# Patient Record
Sex: Female | Born: 1955 | Race: White | Hispanic: No | Marital: Single | Smoking: Never smoker
Health system: Southern US, Community
[De-identification: ages and names within clinical notes are randomized; demographics above are authoritative.]

## PROBLEM LIST (undated history)

## (undated) DIAGNOSIS — M8580 Other specified disorders of bone density and structure, unspecified site: Secondary | ICD-10-CM

## (undated) DIAGNOSIS — D0370 Melanoma in situ of unspecified lower limb, including hip: Secondary | ICD-10-CM

## (undated) HISTORY — DX: Melanoma in situ of unspecified lower limb, including hip: D03.70

---
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
## 2001-08-20 ENCOUNTER — Encounter: Payer: Self-pay | Admitting: Obstetrics and Gynecology

## 2001-08-20 ENCOUNTER — Encounter: Admission: RE | Admit: 2001-08-20 | Discharge: 2001-08-20 | Payer: Self-pay | Admitting: Obstetrics and Gynecology

## 2002-08-21 ENCOUNTER — Encounter: Payer: Self-pay | Admitting: Obstetrics and Gynecology

## 2002-08-21 ENCOUNTER — Encounter: Admission: RE | Admit: 2002-08-21 | Discharge: 2002-08-21 | Payer: Self-pay | Admitting: Obstetrics and Gynecology

## 2003-08-24 ENCOUNTER — Encounter: Admission: RE | Admit: 2003-08-24 | Discharge: 2003-08-24 | Payer: Self-pay | Admitting: Obstetrics and Gynecology

## 2003-08-24 ENCOUNTER — Encounter: Payer: Self-pay | Admitting: Obstetrics and Gynecology

## 2005-09-12 ENCOUNTER — Encounter: Admission: RE | Admit: 2005-09-12 | Discharge: 2005-09-12 | Payer: Self-pay | Admitting: Obstetrics and Gynecology

## 2005-09-15 ENCOUNTER — Encounter: Admission: RE | Admit: 2005-09-15 | Discharge: 2005-09-15 | Payer: Self-pay | Admitting: Obstetrics and Gynecology

## 2006-09-19 ENCOUNTER — Encounter: Admission: RE | Admit: 2006-09-19 | Discharge: 2006-09-19 | Payer: Self-pay | Admitting: Obstetrics and Gynecology

## 2008-06-30 ENCOUNTER — Encounter: Admission: RE | Admit: 2008-06-30 | Discharge: 2008-06-30 | Payer: Self-pay | Admitting: Obstetrics and Gynecology

## 2008-07-08 ENCOUNTER — Encounter: Admission: RE | Admit: 2008-07-08 | Discharge: 2008-07-08 | Payer: Self-pay | Admitting: Obstetrics and Gynecology

## 2009-07-20 ENCOUNTER — Encounter: Admission: RE | Admit: 2009-07-20 | Discharge: 2009-07-20 | Payer: Self-pay | Admitting: Obstetrics and Gynecology

## 2009-12-04 DIAGNOSIS — D0370 Melanoma in situ of unspecified lower limb, including hip: Secondary | ICD-10-CM

## 2009-12-04 HISTORY — PX: OTHER SURGICAL HISTORY: SHX169

## 2009-12-04 HISTORY — DX: Melanoma in situ of unspecified lower limb, including hip: D03.70

## 2010-09-13 ENCOUNTER — Encounter: Admission: RE | Admit: 2010-09-13 | Discharge: 2010-09-13 | Payer: Self-pay | Admitting: Gynecology

## 2010-09-20 ENCOUNTER — Other Ambulatory Visit: Admission: RE | Admit: 2010-09-20 | Discharge: 2010-09-20 | Payer: Self-pay | Admitting: Gynecology

## 2010-09-20 ENCOUNTER — Ambulatory Visit: Payer: Self-pay | Admitting: Gynecology

## 2012-08-09 ENCOUNTER — Other Ambulatory Visit: Payer: Self-pay | Admitting: Gynecology

## 2012-08-09 DIAGNOSIS — Z1231 Encounter for screening mammogram for malignant neoplasm of breast: Secondary | ICD-10-CM

## 2012-08-15 ENCOUNTER — Other Ambulatory Visit (HOSPITAL_COMMUNITY)
Admission: RE | Admit: 2012-08-15 | Discharge: 2012-08-15 | Disposition: A | Discharge status: Home / Self Care | Payer: BC Managed Care – PPO | Source: Ambulatory Visit | Attending: Gynecology | Admitting: Gynecology

## 2012-08-15 ENCOUNTER — Ambulatory Visit (INDEPENDENT_AMBULATORY_CARE_PROVIDER_SITE_OTHER): Payer: BC Managed Care – PPO | Admitting: Gynecology

## 2012-08-15 ENCOUNTER — Encounter: Payer: Self-pay | Admitting: Gynecology

## 2012-08-15 VITALS — BP 94/58 | Ht 65.5 in | Wt 163.0 lb

## 2012-08-15 DIAGNOSIS — Z1151 Encounter for screening for human papillomavirus (HPV): Secondary | ICD-10-CM | POA: Insufficient documentation

## 2012-08-15 DIAGNOSIS — Z01419 Encounter for gynecological examination (general) (routine) without abnormal findings: Secondary | ICD-10-CM | POA: Insufficient documentation

## 2012-08-15 DIAGNOSIS — N926 Irregular menstruation, unspecified: Secondary | ICD-10-CM

## 2012-08-15 NOTE — Patient Instructions (Signed)
Follow up in one year for annual gynecologic exam. Keep menstrual calendar as long as less frequent but "normal" menses then will monitor. If you go more than one year without bleeding and then have a bleed or have a prolonged or atypical period follow up for further evaluation.

## 2012-08-15 NOTE — Progress Notes (Signed)
Destiny Smith 1956/06/20 [id number]        56 y.o.  G0P0 for annual exam.  Several issues noted below.  Past medical history,surgical history, medications, allergies, family history and social history were all reviewed and documented in the EPIC chart. ROS:  Was performed and pertinent positives and negatives are included in the history.  Exam: Destiny Smith assistant Filed Vitals:   08/15/12 1538  BP: 94/58  Height: 5' 5.5" (1.664 m)  Weight: 163 lb (73.936 kg)   General appearance  Normal Skin grossly normal Head/Neck normal with no cervical or supraclavicular adenopathy thyroid normal Lungs  clear Cardiac RR, without RMG Abdominal  soft, nontender, without masses, organomegaly or hernia Breasts  examined lying and sitting without masses, retractions, discharge or axillary adenopathy. Pelvic  Ext/BUS/vagina  normal with atrophic changes  Cervix  normal with atrophic changes. Pap/HPV  Uterus  axial, normal size, shape and contour, midline and mobile nontender   Adnexa  Without masses or tenderness    Anus and perineum  normal   Rectovaginal  normal sphincter tone without palpated masses or tenderness.    Assessment/Plan:  56 y.o. G0P0 female for annual exam.   1. Perimenopausal. Patient with some hot flushes and sweats treated with OTC soy based. Menses irregular with LMP in April. Has not gone a full year. They are normal when they occur. Patient will continue menstrual calendar and as long as they are normal she will monitor. If she goes more than a year without bleeding and then bleeds or if she has prolonged or atypical bleeding she is to call me. 2. Mild incontinence. Patient has classic symptoms of stress incontinence with mild loss of urine occasionally was laughing coughing sneezing. Not overly bothersome. I reviewed issues with her and she is going to monitor at present. Exam is normal without evidence of prolapse/ cystocele/rectocele. 3. Mammography.  Patient has her mammogram  scheduled and will follow up for this and continue with annual mammography. SBE monthly reviewed. 4. Pap smear. Pap/HPV done. Last Pap smear 2011. No history of abnormal Pap smears. If this Pap smear is normal them plan every five-year screening per current screening guidelines. 5. Colonoscopy. Patient had colonoscopy in July 2013. She will follow up with their recommended interval. 6. Health maintenance. Patient reports having her routine blood work done through a work clinic to include lipid profile glucose. Will check urinalysis. Follow up one year, sooner as needed.    Destiny Lords MD, 4:03 PM 08/15/2012

## 2012-08-16 LAB — URINALYSIS W MICROSCOPIC + REFLEX CULTURE
Bacteria, UA: NONE SEEN
Crystals: NONE SEEN
Ketones, ur: NEGATIVE mg/dL
Nitrite: NEGATIVE
Specific Gravity, Urine: 1.02 (ref 1.005–1.030)
Urobilinogen, UA: 0.2 mg/dL (ref 0.0–1.0)
pH: 5.5 (ref 5.0–8.0)

## 2012-08-17 LAB — URINE CULTURE: Colony Count: 35000

## 2012-08-26 ENCOUNTER — Ambulatory Visit
Admission: RE | Admit: 2012-08-26 | Discharge: 2012-08-26 | Disposition: A | Discharge status: Home / Self Care | Payer: BC Managed Care – PPO | Source: Ambulatory Visit | Attending: Gynecology | Admitting: Gynecology

## 2012-08-26 DIAGNOSIS — Z1231 Encounter for screening mammogram for malignant neoplasm of breast: Secondary | ICD-10-CM

## 2012-08-28 ENCOUNTER — Other Ambulatory Visit: Payer: Self-pay | Admitting: *Deleted

## 2012-08-28 DIAGNOSIS — R928 Other abnormal and inconclusive findings on diagnostic imaging of breast: Secondary | ICD-10-CM

## 2012-08-29 ENCOUNTER — Ambulatory Visit
Admission: RE | Admit: 2012-08-29 | Discharge: 2012-08-29 | Disposition: A | Discharge status: Home / Self Care | Payer: BC Managed Care – PPO | Source: Ambulatory Visit | Attending: Gynecology | Admitting: Gynecology

## 2012-08-29 DIAGNOSIS — R928 Other abnormal and inconclusive findings on diagnostic imaging of breast: Secondary | ICD-10-CM

## 2013-08-19 ENCOUNTER — Ambulatory Visit (INDEPENDENT_AMBULATORY_CARE_PROVIDER_SITE_OTHER): Payer: BC Managed Care – PPO | Admitting: Gynecology

## 2013-08-19 ENCOUNTER — Encounter: Payer: Self-pay | Admitting: Gynecology

## 2013-08-19 VITALS — BP 114/70 | Ht 66.0 in | Wt 168.0 lb

## 2013-08-19 DIAGNOSIS — N952 Postmenopausal atrophic vaginitis: Secondary | ICD-10-CM

## 2013-08-19 DIAGNOSIS — Z01419 Encounter for gynecological examination (general) (routine) without abnormal findings: Secondary | ICD-10-CM

## 2013-08-19 NOTE — Progress Notes (Signed)
Destiny Smith Jul 02, 1956 [id number]        57 y.o.  G0P0 for annual exam.  Doing well without complaints.  Past medical history,surgical history, medications, allergies, family history and social history were all reviewed and documented in the EPIC chart.  ROS:  Performed and pertinent positives and negatives are included in the history, assessment and plan .  Exam: [name] assistant Filed Vitals:   08/19/13 1547  BP: 114/70  Height: 5\' 6"  (1.676 m)  Weight: 168 lb (76.204 kg)   General appearance  Normal Skin grossly normal Head/Neck normal with no cervical or supraclavicular adenopathy thyroid normal Lungs  clear Cardiac RR, without RMG Abdominal  soft, nontender, without masses, organomegaly or hernia Breasts  examined lying and sitting without masses, retractions, discharge or axillary adenopathy. Pelvic  Ext/BUS/vagina  normal with mild atrophic changes  Cervix  Normal  Uterus  anteverted, normal size, shape and contour, midline and mobile nontender   Adnexa  Without masses or tenderness    Anus and perineum  normal   Rectovaginal  normal sphincter tone without palpated masses or tenderness.    Assessment/Plan:  57 y.o. G0P0 female for annual exam.   1. Postmenopausal. LMP 03/2013 with no bleeding afterwards. Some mild hot flushes which Estroven helps. Will monitor at present. If worsens will represent to discuss HRT. Not interested at this time. Knows to report any vaginal bleeding. 2. Pap/HPV negative last year. No Pap smear done today. Repeat in 3-5 year interval. No history of abnormal Pap smears previously. 3. Mammography 08/2012. Patient has scheduled. SBE monthly reviewed. 4. Colonoscopy 2013. Repeated there recommended interval. 5.  DEXA. And baseline further into the menopause. Increase calcium vitamin D reviewed. 6. Health maintenance. Routine blood work all done through work clinic and reportedly normal. Followup in one year, sooner as needed.  Note: This document  was prepared with digital dictation and possible smart phrase technology. Any transcriptional errors that result from this process are unintentional.   Dara Lords MD, 4:05 PM 08/19/2013

## 2013-08-19 NOTE — Patient Instructions (Signed)
Follow up in one year, sooner as needed. 

## 2013-08-20 LAB — URINALYSIS W MICROSCOPIC + REFLEX CULTURE
Bilirubin Urine: NEGATIVE
Casts: NONE SEEN
Glucose, UA: NEGATIVE mg/dL
Hgb urine dipstick: NEGATIVE
Ketones, ur: NEGATIVE mg/dL
Leukocytes, UA: NEGATIVE
Nitrite: NEGATIVE
Protein, ur: NEGATIVE mg/dL
Specific Gravity, Urine: 1.027 (ref 1.005–1.030)
Urobilinogen, UA: 0.2 mg/dL (ref 0.0–1.0)
pH: 5.5 (ref 5.0–8.0)

## 2013-09-17 ENCOUNTER — Other Ambulatory Visit: Payer: Self-pay

## 2013-09-17 DIAGNOSIS — Z1231 Encounter for screening mammogram for malignant neoplasm of breast: Secondary | ICD-10-CM

## 2013-09-25 ENCOUNTER — Ambulatory Visit
Admission: RE | Admit: 2013-09-25 | Discharge: 2013-09-25 | Disposition: A | Discharge status: Home / Self Care | Payer: BC Managed Care – PPO | Source: Ambulatory Visit

## 2013-09-25 DIAGNOSIS — Z1231 Encounter for screening mammogram for malignant neoplasm of breast: Secondary | ICD-10-CM

## 2013-09-30 ENCOUNTER — Other Ambulatory Visit: Payer: Self-pay | Admitting: Gynecology

## 2013-09-30 DIAGNOSIS — R928 Other abnormal and inconclusive findings on diagnostic imaging of breast: Secondary | ICD-10-CM

## 2013-10-16 ENCOUNTER — Ambulatory Visit
Admission: RE | Admit: 2013-10-16 | Discharge: 2013-10-16 | Disposition: A | Discharge status: Home / Self Care | Payer: BC Managed Care – PPO | Source: Ambulatory Visit | Attending: Gynecology | Admitting: Gynecology

## 2013-10-16 DIAGNOSIS — R928 Other abnormal and inconclusive findings on diagnostic imaging of breast: Secondary | ICD-10-CM

## 2014-09-25 ENCOUNTER — Ambulatory Visit (INDEPENDENT_AMBULATORY_CARE_PROVIDER_SITE_OTHER): Payer: BC Managed Care – PPO | Admitting: Gynecology

## 2014-09-25 ENCOUNTER — Encounter: Payer: Self-pay | Admitting: Gynecology

## 2014-09-25 VITALS — BP 112/70 | Ht 66.0 in | Wt 174.0 lb

## 2014-09-25 DIAGNOSIS — Z01419 Encounter for gynecological examination (general) (routine) without abnormal findings: Secondary | ICD-10-CM

## 2014-09-25 DIAGNOSIS — N952 Postmenopausal atrophic vaginitis: Secondary | ICD-10-CM

## 2014-09-25 NOTE — Patient Instructions (Signed)
You may obtain a copy of any labs that were done today by logging onto MyChart as outlined in the instructions provided with your AVS (after visit summary). The office will not call with normal lab results but certainly if there are any significant abnormalities then we will contact you.   Health Maintenance, Female A healthy lifestyle and preventative care can promote health and wellness.  Maintain regular health, dental, and eye exams.  Eat a healthy diet. Foods like vegetables, fruits, whole grains, low-fat dairy products, and lean protein foods contain the nutrients you need without too many calories. Decrease your intake of foods high in solid fats, added sugars, and salt. Get information about a proper diet from your caregiver, if necessary.  Regular physical exercise is one of the most important things you can do for your health. Most adults should get at least 150 minutes of moderate-intensity exercise (any activity that increases your heart rate and causes you to sweat) each week. In addition, most adults need muscle-strengthening exercises on 2 or more days a week.   Maintain a healthy weight. The body mass index (BMI) is a screening tool to identify possible weight problems. It provides an estimate of body fat based on height and weight. Your caregiver can help determine your BMI, and can help you achieve or maintain a healthy weight. For adults 20 years and older:  A BMI below 18.5 is considered underweight.  A BMI of 18.5 to 24.9 is normal.  A BMI of 25 to 29.9 is considered overweight.  A BMI of 30 and above is considered obese.  Maintain normal blood lipids and cholesterol by exercising and minimizing your intake of saturated fat. Eat a balanced diet with plenty of fruits and vegetables. Blood tests for lipids and cholesterol should begin at age 61 and be repeated every 5 years. If your lipid or cholesterol levels are high, you are over 50, or you are a high risk for heart  disease, you may need your cholesterol levels checked more frequently.Ongoing high lipid and cholesterol levels should be treated with medicines if diet and exercise are not effective.  If you smoke, find out from your caregiver how to quit. If you do not use tobacco, do not start.  Lung cancer screening is recommended for adults aged 33 80 years who are at high risk for developing lung cancer because of a history of smoking. Yearly low-dose computed tomography (CT) is recommended for people who have at least a 30-pack-year history of smoking and are a current smoker or have quit within the past 15 years. A pack year of smoking is smoking an average of 1 pack of cigarettes a day for 1 year (for example: 1 pack a day for 30 years or 2 packs a day for 15 years). Yearly screening should continue until the smoker has stopped smoking for at least 15 years. Yearly screening should also be stopped for people who develop a health problem that would prevent them from having lung cancer treatment.  If you are pregnant, do not drink alcohol. If you are breastfeeding, be very cautious about drinking alcohol. If you are not pregnant and choose to drink alcohol, do not exceed 1 drink per day. One drink is considered to be 12 ounces (355 mL) of beer, 5 ounces (148 mL) of wine, or 1.5 ounces (44 mL) of liquor.  Avoid use of street drugs. Do not share needles with anyone. Ask for help if you need support or instructions about stopping  the use of drugs.  High blood pressure causes heart disease and increases the risk of stroke. Blood pressure should be checked at least every 1 to 2 years. Ongoing high blood pressure should be treated with medicines, if weight loss and exercise are not effective.  If you are 59 to 56 years old, ask your caregiver if you should take aspirin to prevent strokes.  Diabetes screening involves taking a blood sample to check your fasting blood sugar level. This should be done once every 3  years, after age 91, if you are within normal weight and without risk factors for diabetes. Testing should be considered at a younger age or be carried out more frequently if you are overweight and have at least 1 risk factor for diabetes.  Breast cancer screening is essential preventative care for women. You should practice "breast self-awareness." This means understanding the normal appearance and feel of your breasts and may include breast self-examination. Any changes detected, no matter how small, should be reported to a caregiver. Women in their 66s and 30s should have a clinical breast exam (CBE) by a caregiver as part of a regular health exam every 1 to 3 years. After age 101, women should have a CBE every year. Starting at age 100, women should consider having a mammogram (breast X-ray) every year. Women who have a family history of breast cancer should talk to their caregiver about genetic screening. Women at a high risk of breast cancer should talk to their caregiver about having an MRI and a mammogram every year.  Breast cancer gene (BRCA)-related cancer risk assessment is recommended for women who have family members with BRCA-related cancers. BRCA-related cancers include breast, ovarian, tubal, and peritoneal cancers. Having family members with these cancers may be associated with an increased risk for harmful changes (mutations) in the breast cancer genes BRCA1 and BRCA2. Results of the assessment will determine the need for genetic counseling and BRCA1 and BRCA2 testing.  The Pap test is a screening test for cervical cancer. Women should have a Pap test starting at age 57. Between ages 25 and 35, Pap tests should be repeated every 2 years. Beginning at age 37, you should have a Pap test every 3 years as long as the past 3 Pap tests have been normal. If you had a hysterectomy for a problem that was not cancer or a condition that could lead to cancer, then you no longer need Pap tests. If you are  between ages 50 and 76, and you have had normal Pap tests going back 10 years, you no longer need Pap tests. If you have had past treatment for cervical cancer or a condition that could lead to cancer, you need Pap tests and screening for cancer for at least 20 years after your treatment. If Pap tests have been discontinued, risk factors (such as a new sexual partner) need to be reassessed to determine if screening should be resumed. Some women have medical problems that increase the chance of getting cervical cancer. In these cases, your caregiver may recommend more frequent screening and Pap tests.  The human papillomavirus (HPV) test is an additional test that may be used for cervical cancer screening. The HPV test looks for the virus that can cause the cell changes on the cervix. The cells collected during the Pap test can be tested for HPV. The HPV test could be used to screen women aged 44 years and older, and should be used in women of any age  who have unclear Pap test results. After the age of 55, women should have HPV testing at the same frequency as a Pap test.  Colorectal cancer can be detected and often prevented. Most routine colorectal cancer screening begins at the age of 44 and continues through age 20. However, your caregiver may recommend screening at an earlier age if you have risk factors for colon cancer. On a yearly basis, your caregiver may provide home test kits to check for hidden blood in the stool. Use of a small camera at the end of a tube, to directly examine the colon (sigmoidoscopy or colonoscopy), can detect the earliest forms of colorectal cancer. Talk to your caregiver about this at age 86, when routine screening begins. Direct examination of the colon should be repeated every 5 to 10 years through age 13, unless early forms of pre-cancerous polyps or small growths are found.  Hepatitis C blood testing is recommended for all people born from [birth year] through 1965 and any  individual with known risks for hepatitis C.  Practice safe sex. Use condoms and avoid high-risk sexual practices to reduce the spread of sexually transmitted infections (STIs). Sexually active women aged 36 and younger should be checked for Chlamydia, which is a common sexually transmitted infection. Older women with new or multiple partners should also be tested for Chlamydia. Testing for other STIs is recommended if you are sexually active and at increased risk.  Osteoporosis is a disease in which the bones lose minerals and strength with aging. This can result in serious bone fractures. The risk of osteoporosis can be identified using a bone density scan. Women ages 20 and over and women at risk for fractures or osteoporosis should discuss screening with their caregivers. Ask your caregiver whether you should be taking a calcium supplement or vitamin D to reduce the rate of osteoporosis.  Menopause can be associated with physical symptoms and risks. Hormone replacement therapy is available to decrease symptoms and risks. You should talk to your caregiver about whether hormone replacement therapy is right for you.  Use sunscreen. Apply sunscreen liberally and repeatedly throughout the day. You should seek shade when your shadow is shorter than you. Protect yourself by wearing long sleeves, pants, a wide-brimmed hat, and sunglasses year round, whenever you are outdoors.  Notify your caregiver of new moles or changes in moles, especially if there is a change in shape or color. Also notify your caregiver if a mole is larger than the size of a pencil eraser.  Stay current with your immunizations. Document Released: 06/05/2011 Document Revised: 03/17/2013 Document Reviewed: 06/05/2011 [facility] Patient Information 2014 [employer].

## 2014-09-25 NOTE — Progress Notes (Signed)
Destiny Smith 10/06/1956 [id number]        58 y.o.  G0P0 for annual exam.  Doing well. Several issues noted below.  Past medical history,surgical history, problem list, medications, allergies, family history and social history were all reviewed and documented as reviewed in the EPIC chart.  ROS:  12 system ROS performed with pertinent positives and negatives included in the history, assessment and plan.   Additional significant findings :  none   Exam: [name] personassistant Filed Vitals:   09/25/14 1533  BP: 112/70  Height: 5\' 6"  (1.676 m)  Weight: 174 lb (78.926 kg)   General appearance:  Normal affect, orientation and appearance. Skin: Grossly normal HEENT: Without gross lesions.  No cervical or supraclavicular adenopathy. Thyroid normal.  Lungs:  Clear without wheezing, rales or rhonchi Cardiac: RR, without RMG Abdominal:  Soft, nontender, without masses, guarding, rebound, organomegaly or hernia Breasts:  Examined lying and sitting without masses, retractions, discharge or axillary adenopathy. Pelvic:  Ext/BUS/vagina with significant atrophic changes  Cervix atrophic  Uterus anteverted, normal size, shape and contour, midline and mobile nontender   Adnexa  Without masses or tenderness    Anus and perineum  Normal   Rectovaginal  Normal sphincter tone without palpated masses or tenderness.    Assessment/Plan:  58 y.o. G0P0 female for annual exam.   1. Postmenopausal/atrophic genital changes. Patient without significant symptoms of hot flushes, night sweats, vaginal dryness. Is not sexually active. No vaginal bleeding. Continue to monitor. Report any vaginal bleeding. 2. Pap smear/HPV negative 2013. No Pap smear done today. No history of significant abnormal Pap smears. Repeat Pap smear at 3-5 year interval per current screening guidelines. 3. Mammography 10/2013. Patient will schedule this coming November. SBE monthly reviewed. 4. Colonoscopy 2014. Repeat at their recommended  interval. 5. DEXA never. Will plan at 62. Increase calcium vitamin D recommendations reviewed. 6. Health maintenance. No routine blood work done and she reports as done at her primary physician's office. Follow up in one year, sooner as needed.    [name],TIMOTHY P MD, 3:54 PM 09/25/2014

## 2014-09-26 LAB — URINALYSIS W MICROSCOPIC + REFLEX CULTURE
BACTERIA UA: NONE SEEN
BILIRUBIN URINE: NEGATIVE
Casts: NONE SEEN
GLUCOSE, UA: NEGATIVE mg/dL
HGB URINE DIPSTICK: NEGATIVE
KETONES UR: NEGATIVE mg/dL
Leukocytes, UA: NEGATIVE
Nitrite: NEGATIVE
PROTEIN: NEGATIVE mg/dL
Specific Gravity, Urine: 1.028 (ref 1.005–1.030)
Squamous Epithelial / LPF: NONE SEEN
UROBILINOGEN UA: 0.2 mg/dL (ref 0.0–1.0)
pH: 5.5 (ref 5.0–8.0)

## 2015-03-12 ENCOUNTER — Other Ambulatory Visit: Payer: Self-pay

## 2015-03-12 DIAGNOSIS — Z1231 Encounter for screening mammogram for malignant neoplasm of breast: Secondary | ICD-10-CM

## 2015-03-24 ENCOUNTER — Ambulatory Visit: Payer: Self-pay

## 2015-03-29 ENCOUNTER — Ambulatory Visit
Admission: RE | Admit: 2015-03-29 | Discharge: 2015-03-29 | Disposition: A | Discharge status: Home / Self Care | Payer: Self-pay | Source: Ambulatory Visit

## 2015-03-29 DIAGNOSIS — Z1231 Encounter for screening mammogram for malignant neoplasm of breast: Secondary | ICD-10-CM

## 2015-03-30 ENCOUNTER — Other Ambulatory Visit: Payer: Self-pay | Admitting: Gynecology

## 2015-03-30 DIAGNOSIS — R928 Other abnormal and inconclusive findings on diagnostic imaging of breast: Secondary | ICD-10-CM

## 2015-03-31 ENCOUNTER — Ambulatory Visit
Admission: RE | Admit: 2015-03-31 | Discharge: 2015-03-31 | Disposition: A | Discharge status: Home / Self Care | Payer: BLUE CROSS/BLUE SHIELD | Source: Ambulatory Visit | Attending: Gynecology | Admitting: Gynecology

## 2015-03-31 DIAGNOSIS — R928 Other abnormal and inconclusive findings on diagnostic imaging of breast: Secondary | ICD-10-CM

## 2016-06-01 ENCOUNTER — Other Ambulatory Visit: Payer: Self-pay | Admitting: Gynecology

## 2016-06-01 DIAGNOSIS — Z1231 Encounter for screening mammogram for malignant neoplasm of breast: Secondary | ICD-10-CM

## 2016-06-02 DIAGNOSIS — M2042 Other hammer toe(s) (acquired), left foot: Secondary | ICD-10-CM | POA: Diagnosis not present

## 2016-06-02 DIAGNOSIS — M2041 Other hammer toe(s) (acquired), right foot: Secondary | ICD-10-CM | POA: Diagnosis not present

## 2016-06-02 DIAGNOSIS — M2012 Hallux valgus (acquired), left foot: Secondary | ICD-10-CM | POA: Diagnosis not present

## 2016-06-02 DIAGNOSIS — D2372 Other benign neoplasm of skin of left lower limb, including hip: Secondary | ICD-10-CM | POA: Diagnosis not present

## 2016-06-02 DIAGNOSIS — M2011 Hallux valgus (acquired), right foot: Secondary | ICD-10-CM | POA: Diagnosis not present

## 2016-06-16 DIAGNOSIS — M2012 Hallux valgus (acquired), left foot: Secondary | ICD-10-CM | POA: Diagnosis not present

## 2016-06-16 DIAGNOSIS — D2372 Other benign neoplasm of skin of left lower limb, including hip: Secondary | ICD-10-CM | POA: Diagnosis not present

## 2016-06-16 DIAGNOSIS — M2011 Hallux valgus (acquired), right foot: Secondary | ICD-10-CM | POA: Diagnosis not present

## 2016-06-16 DIAGNOSIS — M2042 Other hammer toe(s) (acquired), left foot: Secondary | ICD-10-CM | POA: Diagnosis not present

## 2016-06-19 ENCOUNTER — Ambulatory Visit
Admission: RE | Admit: 2016-06-19 | Discharge: 2016-06-19 | Disposition: A | Discharge status: Home / Self Care | Payer: BLUE CROSS/BLUE SHIELD | Source: Ambulatory Visit | Attending: Gynecology | Admitting: Gynecology

## 2016-06-19 DIAGNOSIS — Z1231 Encounter for screening mammogram for malignant neoplasm of breast: Secondary | ICD-10-CM

## 2016-07-07 ENCOUNTER — Encounter: Payer: Self-pay | Admitting: Gynecology

## 2016-07-07 ENCOUNTER — Ambulatory Visit (INDEPENDENT_AMBULATORY_CARE_PROVIDER_SITE_OTHER): Payer: BLUE CROSS/BLUE SHIELD | Admitting: Gynecology

## 2016-07-07 VITALS — BP 118/74 | Ht 65.0 in | Wt 165.0 lb

## 2016-07-07 DIAGNOSIS — N952 Postmenopausal atrophic vaginitis: Secondary | ICD-10-CM

## 2016-07-07 DIAGNOSIS — Z01419 Encounter for gynecological examination (general) (routine) without abnormal findings: Secondary | ICD-10-CM

## 2016-07-07 NOTE — Patient Instructions (Signed)

## 2016-07-07 NOTE — Progress Notes (Signed)
    Destiny Smith Oct 20, 1956 [id number]        60 y.o.  G0P0  for annual exam.  Doing well without complaints.  Past medical history,surgical history, problem list, medications, allergies, family history and social history were all reviewed and documented as reviewed in the EPIC chart.  ROS:  Performed with pertinent positives and negatives included in the history, assessment and plan.   Additional significant findings :  None   Exam: Destiny Smith assistant Vitals:   07/07/16 1134  BP: 118/74  Weight: 165 lb (74.8 kg)  Height: 5\' 5"  (1.651 m)   Body mass index is 27.46 kg/m.  General appearance:  Normal affect, orientation and appearance. Skin: Grossly normal HEENT: Without gross lesions.  No cervical or supraclavicular adenopathy. Thyroid normal.  Lungs:  Clear without wheezing, rales or rhonchi Cardiac: RR, without RMG Abdominal:  Soft, nontender, without masses, guarding, rebound, organomegaly or hernia Breasts:  Examined lying and sitting without masses, retractions, discharge or axillary adenopathy. Pelvic:  Ext/BUS/Vagina with atrophic changes  Cervix with atrophic changes  Uterus anteverted, normal size, shape and contour, midline and mobile nontender   Adnexa without masses or tenderness    Anus and perineum normal   Rectovaginal normal sphincter tone without palpated masses or tenderness.    Assessment/Plan:  60 y.o. G0P0 female for annual exam.  1. Postmenopausal/atrophic genital changes. No significant hot flushes, night sweats, vaginal dryness or any vaginal bleeding. Continue to monitor report any issues or vaginal bleeding. 2. Pap smear/HPV 2013 negative. No Pap smear done today. No history of abnormal Pap smears previously. Plan repeat Pap smear next year at 5 year interval per current screening guidelines. 3. Mammography 06/2016. Continue with annual mammography when due. SBE monthly reviewed. 4. Colonoscopy 2014. Repeat at their recommended  interval. 5. DEXA never. Will plan baseline at age 21. Increased calcium vitamin D reviewed. 6. Health maintenance. No routine lab work done as patient reports it done elsewhere. Follow up 1 year, sooner as needed.  Dara Lords MD, 11:49 AM 07/07/2016

## 2016-07-21 DIAGNOSIS — M2011 Hallux valgus (acquired), right foot: Secondary | ICD-10-CM | POA: Diagnosis not present

## 2016-07-21 DIAGNOSIS — M2012 Hallux valgus (acquired), left foot: Secondary | ICD-10-CM | POA: Diagnosis not present

## 2016-07-21 DIAGNOSIS — D2372 Other benign neoplasm of skin of left lower limb, including hip: Secondary | ICD-10-CM | POA: Diagnosis not present

## 2016-07-21 DIAGNOSIS — M2042 Other hammer toe(s) (acquired), left foot: Secondary | ICD-10-CM | POA: Diagnosis not present

## 2016-12-07 DIAGNOSIS — H5213 Myopia, bilateral: Secondary | ICD-10-CM | POA: Diagnosis not present

## 2017-01-22 DIAGNOSIS — L570 Actinic keratosis: Secondary | ICD-10-CM | POA: Diagnosis not present

## 2017-01-22 DIAGNOSIS — D2262 Melanocytic nevi of left upper limb, including shoulder: Secondary | ICD-10-CM | POA: Diagnosis not present

## 2017-01-22 DIAGNOSIS — L821 Other seborrheic keratosis: Secondary | ICD-10-CM | POA: Diagnosis not present

## 2017-01-22 DIAGNOSIS — D2261 Melanocytic nevi of right upper limb, including shoulder: Secondary | ICD-10-CM | POA: Diagnosis not present

## 2017-01-22 DIAGNOSIS — D225 Melanocytic nevi of trunk: Secondary | ICD-10-CM | POA: Diagnosis not present

## 2017-03-13 ENCOUNTER — Encounter: Payer: Self-pay | Admitting: Podiatry

## 2017-03-13 ENCOUNTER — Ambulatory Visit (INDEPENDENT_AMBULATORY_CARE_PROVIDER_SITE_OTHER): Payer: BLUE CROSS/BLUE SHIELD | Admitting: Podiatry

## 2017-03-13 ENCOUNTER — Ambulatory Visit (INDEPENDENT_AMBULATORY_CARE_PROVIDER_SITE_OTHER): Payer: BLUE CROSS/BLUE SHIELD

## 2017-03-13 VITALS — BP 113/68 | HR 75 | Resp 16 | Ht 66.0 in | Wt 168.0 lb

## 2017-03-13 DIAGNOSIS — M2010 Hallux valgus (acquired), unspecified foot: Secondary | ICD-10-CM

## 2017-03-13 DIAGNOSIS — Q828 Other specified congenital malformations of skin: Secondary | ICD-10-CM | POA: Diagnosis not present

## 2017-03-13 DIAGNOSIS — M2041 Other hammer toe(s) (acquired), right foot: Secondary | ICD-10-CM

## 2017-03-13 NOTE — Progress Notes (Signed)
   Subjective:    Patient ID: Destiny Smith, female    DOB: 07/13/1956, 61 y.o.   MRN: [id number]  HPI she presents today with chief complaint of calluses to the medial aspect of the IP joint hallux bilateral. She states this is been going on for several years now. She is also complaining of calluses beneath the second metatarsophalangeal joint first metatarsophalangeal joint of the right foot and some contracture deformity second digit of the right foot. She states that it makes wearing shoes uncomfortable.    Review of Systems  All other systems reviewed and are negative.      Objective:   Physical Exam: Vital signs are stable alert and oriented 3. Pulses are palpable. Neurologic sensorium is intact. Deep tendon reflexes are intact. Muscle strength is 5 over 5 dorsiflexion plantar flexors and inverters everters all intrinsic musculature is intact. Orthopedic evaluation demonstrates all joints distal to the ankle for range of motion without crepitation. Cutaneous evaluation demonstrates supple well-hydrated cutis. Will reactive hyperkeratotic lesions at points of irritation from shoe gear. I evaluated her shoe gear today and all of her areas of hyperkeratosis are associated with contact points in her shoes. Her shoes are too narrow and too short for her foot. I expressed this to her today. I debrided all reactive hyperkeratosis for her. Radiographs today do not demonstrate any type of major osseous abnormalities. They do demonstrate thickening of soft tissues around the first metatarsal phalangeal joint and hallux interphalangeal joint.        Assessment & Plan:  Painful porokeratosis and calluses associated with poor shoe gear selection and small shoe.  Plan: Debridement of all reactive hyperkeratosis discussed appropriate shoe gear follow up with me as needed.

## 2017-05-24 ENCOUNTER — Ambulatory Visit (INDEPENDENT_AMBULATORY_CARE_PROVIDER_SITE_OTHER): Payer: BLUE CROSS/BLUE SHIELD

## 2017-05-24 ENCOUNTER — Ambulatory Visit (INDEPENDENT_AMBULATORY_CARE_PROVIDER_SITE_OTHER): Payer: BLUE CROSS/BLUE SHIELD | Admitting: Podiatry

## 2017-05-24 ENCOUNTER — Encounter: Payer: Self-pay | Admitting: Podiatry

## 2017-05-24 DIAGNOSIS — R52 Pain, unspecified: Secondary | ICD-10-CM

## 2017-05-24 DIAGNOSIS — S93412A Sprain of calcaneofibular ligament of left ankle, initial encounter: Secondary | ICD-10-CM | POA: Diagnosis not present

## 2017-05-24 MED ORDER — DICLOFENAC SODIUM 75 MG PO TBEC: 75.0000 mg | DELAYED_RELEASE_TABLET | Freq: Two times a day (BID) | ORAL | 0 refills | Status: DC

## 2017-05-24 NOTE — Progress Notes (Signed)
This patient presents the office with chief complaint of a painful left foot. Patient states that she fell walking up her stairs over the weekend.  She says that she is now experiencing pain on the top of her foot and aching persists from the toes over the top of her left foot  And into left ankle.  She says she thought her injury was not serious, but she has developed pain as well as throbbing which causes her to awaken at night. She presents the office today for an evaluation and treatment of her left foot and ankle.   GENERAL APPEARANCE: Alert, conversant. Appropriately groomed. No acute distress.  VASCULAR: Pedal pulses are  palpable at  [facility] bilateral.  Capillary refill time is immediate to all digits,  Normal temperature gradient.  Digital hair growth is present bilateral  NEUROLOGIC: sensation is normal to 5.07 monofilament at 5/5 sites bilateral.  Light touch is intact bilateral, Muscle strength normal.  MUSCULOSKELETAL: acceptable muscle strength, tone and stability bilateral.  Intrinsic muscluature intact bilateral.  Rectus appearance of foot and digits noted bilateral. Palpable pain noted over the anterior fibulotalar ligament and the calcaneal fibular ligament left ankle.    DERMATOLOGIC: skin color, texture, and turgor are within normal limits.  No preulcerative lesions or ulcers  are seen, no interdigital maceration noted.  No open lesions present.  Digital nails are asymptomatic. No drainage noted. Multiple plantar porokeratosis noted  B/L.  Ankle Sprain left foot/ankle   ROV  x-rays revealed no evidence of bony pathology.  Palpable pain noted to the lateral ligaments left ankle.  I believe the pain that she is experiencing pain  on the dorsum of her foot that extends into her ankle is related to compensatory changes during her gait  due to the ankle sprain..  Prescribed an anklet for her to wear on her left foot and ankle.  Prescribed Voltaren 75 mg to be taken until the pain has  resolved.  Return to clinic when necessary   Helane GuntherGregory Ojas Coone DPM

## 2017-07-06 ENCOUNTER — Other Ambulatory Visit: Payer: Self-pay | Admitting: Gynecology

## 2017-07-06 DIAGNOSIS — Z1231 Encounter for screening mammogram for malignant neoplasm of breast: Secondary | ICD-10-CM

## 2017-07-16 ENCOUNTER — Ambulatory Visit: Payer: BLUE CROSS/BLUE SHIELD

## 2017-07-23 ENCOUNTER — Ambulatory Visit
Admission: RE | Admit: 2017-07-23 | Discharge: 2017-07-23 | Disposition: A | Discharge status: Home / Self Care | Payer: BLUE CROSS/BLUE SHIELD | Source: Ambulatory Visit | Attending: Gynecology | Admitting: Gynecology

## 2017-07-23 DIAGNOSIS — Z1231 Encounter for screening mammogram for malignant neoplasm of breast: Secondary | ICD-10-CM

## 2017-08-14 ENCOUNTER — Encounter: Payer: Self-pay | Admitting: Gynecology

## 2017-08-14 ENCOUNTER — Ambulatory Visit (INDEPENDENT_AMBULATORY_CARE_PROVIDER_SITE_OTHER): Payer: BLUE CROSS/BLUE SHIELD | Admitting: Gynecology

## 2017-08-14 VITALS — BP 124/78 | Ht 66.0 in | Wt 179.0 lb

## 2017-08-14 DIAGNOSIS — N952 Postmenopausal atrophic vaginitis: Secondary | ICD-10-CM

## 2017-08-14 DIAGNOSIS — Z01411 Encounter for gynecological examination (general) (routine) with abnormal findings: Secondary | ICD-10-CM | POA: Diagnosis not present

## 2017-08-14 NOTE — Progress Notes (Signed)
    Destiny Smith 10/23/1956 [id number]        61 y.o.  G0P0 for annual gynecologic exam.  Doing well without complaints  Past medical history,surgical history, problem list, medications, allergies, family history and social history were all reviewed and documented as reviewed in the EPIC chart.  ROS:  Performed with pertinent positives and negatives included in the history, assessment and plan.   Additional significant findings :  None   Exam: Bari MantisKim Alexis assistant Vitals:   08/14/17 1546  BP: 124/78  Weight: 179 lb (81.2 kg)  Height: 5\' 6"  (1.676 m)   Body mass index is 28.89 kg/m.  General appearance:  Normal affect, orientation and appearance. Skin: Grossly normal HEENT: Without gross lesions.  No cervical or supraclavicular adenopathy. Thyroid normal.  Lungs:  Clear without wheezing, rales or rhonchi Cardiac: RR, without RMG Abdominal:  Soft, nontender, without masses, guarding, rebound, organomegaly or hernia Breasts:  Examined lying and sitting without masses, retractions, discharge or axillary adenopathy. Pelvic:  Ext, BUS, Vagina: With atrophic changes  Cervix: With atrophic changes. Pap smear/HPV  Uterus: Anteverted, normal size, shape and contour, midline and mobile nontender   Adnexa: Without masses or tenderness    Anus and perineum: Normal   Rectovaginal: Normal sphincter tone without palpated masses or tenderness.    Assessment/Plan:  61 y.o. G0P0 female for annual gynecologic exam.   1. Postmenopausal/atrophic genital changes. No significant hot flushes, night sweats, vaginal dryness or any vaginal bleeding. Continue to monitor report any issues or bleeding. 2. Pap smear/HPV 2013. Pap smear/HPV done today. No history of abnormal Pap smears previously. 3. Mammography 07/2017. Continue with annual mammography when due. Breast exam normal today. SBE monthly reviewed. 4. Colonoscopy 2014. Repeat at their recommended interval. 5. DEXA never. We'll plan at age  61. Reviewed calcium intake and vitamin D supplementation with her. 6. Health maintenance. No routine lab work done as patient reports this done elsewhere. Follow up 1 year, sooner as needed.   [name],Destiny Smith P MD, 4:03 PM 08/14/2017

## 2017-08-14 NOTE — Addendum Note (Signed)
Addended by: Kem ParkinsonBARNES, Annabeth Tortora on: 08/14/2017 04:12 PM   Modules accepted: Orders

## 2017-08-14 NOTE — Patient Instructions (Signed)
Followup in one year for annual exam, sooner if any issues 

## 2017-08-17 LAB — PAP, TP IMAGING W/ HPV RNA, RFLX HPV TYPE 16,18/45: HPV DNA HIGH RISK: NOT DETECTED

## 2017-12-26 DIAGNOSIS — M20012 Mallet finger of left finger(s): Secondary | ICD-10-CM | POA: Diagnosis not present

## 2017-12-26 DIAGNOSIS — M25642 Stiffness of left hand, not elsewhere classified: Secondary | ICD-10-CM | POA: Diagnosis not present

## 2018-01-14 DIAGNOSIS — M20012 Mallet finger of left finger(s): Secondary | ICD-10-CM | POA: Diagnosis not present

## 2018-01-14 DIAGNOSIS — M25642 Stiffness of left hand, not elsewhere classified: Secondary | ICD-10-CM | POA: Diagnosis not present

## 2018-01-28 DIAGNOSIS — M20012 Mallet finger of left finger(s): Secondary | ICD-10-CM | POA: Diagnosis not present

## 2018-02-11 DIAGNOSIS — M25642 Stiffness of left hand, not elsewhere classified: Secondary | ICD-10-CM | POA: Diagnosis not present

## 2018-02-11 DIAGNOSIS — M20012 Mallet finger of left finger(s): Secondary | ICD-10-CM | POA: Diagnosis not present

## 2018-02-19 DIAGNOSIS — L821 Other seborrheic keratosis: Secondary | ICD-10-CM | POA: Diagnosis not present

## 2018-02-19 DIAGNOSIS — L565 Disseminated superficial actinic porokeratosis (DSAP): Secondary | ICD-10-CM | POA: Diagnosis not present

## 2018-02-19 DIAGNOSIS — D485 Neoplasm of uncertain behavior of skin: Secondary | ICD-10-CM | POA: Diagnosis not present

## 2018-02-19 DIAGNOSIS — L820 Inflamed seborrheic keratosis: Secondary | ICD-10-CM | POA: Diagnosis not present

## 2018-02-19 DIAGNOSIS — D2239 Melanocytic nevi of other parts of face: Secondary | ICD-10-CM | POA: Diagnosis not present

## 2018-02-25 DIAGNOSIS — M25642 Stiffness of left hand, not elsewhere classified: Secondary | ICD-10-CM | POA: Diagnosis not present

## 2018-02-25 DIAGNOSIS — M20012 Mallet finger of left finger(s): Secondary | ICD-10-CM | POA: Diagnosis not present

## 2018-03-27 DIAGNOSIS — M20012 Mallet finger of left finger(s): Secondary | ICD-10-CM | POA: Diagnosis not present

## 2019-02-24 DIAGNOSIS — L565 Disseminated superficial actinic porokeratosis (DSAP): Secondary | ICD-10-CM | POA: Diagnosis not present

## 2019-02-24 DIAGNOSIS — Z85820 Personal history of malignant melanoma of skin: Secondary | ICD-10-CM | POA: Diagnosis not present

## 2019-02-24 DIAGNOSIS — L821 Other seborrheic keratosis: Secondary | ICD-10-CM | POA: Diagnosis not present

## 2019-02-24 DIAGNOSIS — D485 Neoplasm of uncertain behavior of skin: Secondary | ICD-10-CM | POA: Diagnosis not present

## 2019-02-24 DIAGNOSIS — L820 Inflamed seborrheic keratosis: Secondary | ICD-10-CM | POA: Diagnosis not present

## 2019-07-29 ENCOUNTER — Other Ambulatory Visit: Payer: Self-pay | Admitting: Gynecology

## 2019-07-29 DIAGNOSIS — Z1231 Encounter for screening mammogram for malignant neoplasm of breast: Secondary | ICD-10-CM

## 2019-08-20 ENCOUNTER — Encounter: Payer: Self-pay | Admitting: Gynecology

## 2019-08-20 ENCOUNTER — Other Ambulatory Visit: Payer: Self-pay

## 2019-08-20 ENCOUNTER — Ambulatory Visit (INDEPENDENT_AMBULATORY_CARE_PROVIDER_SITE_OTHER): Payer: BC Managed Care – PPO | Admitting: Gynecology

## 2019-08-20 VITALS — BP 120/74 | Ht 65.0 in | Wt 174.0 lb

## 2019-08-20 DIAGNOSIS — Z01419 Encounter for gynecological examination (general) (routine) without abnormal findings: Secondary | ICD-10-CM

## 2019-08-20 DIAGNOSIS — N952 Postmenopausal atrophic vaginitis: Secondary | ICD-10-CM

## 2019-08-20 NOTE — Progress Notes (Signed)
    Destiny Smith 1956-01-01 [id number]        62 y.o.  G0P0 for annual gynecologic exam.  Without gynecologic complaints.  Notes some perineal irritation/itching when she was on antibiotics for a tooth abscess but this has resolved.  Past medical history,surgical history, problem list, medications, allergies, family history and social history were all reviewed and documented as reviewed in the EPIC chart.  ROS:  Performed with pertinent positives and negatives included in the history, assessment and plan.   Additional significant findings : None   Exam: Caryn Bee assistant Vitals:   08/20/19 1024  BP: 120/74  Weight: 174 lb (78.9 kg)  Height: 5\' 5"  (1.651 m)   Body mass index is 28.96 kg/m.  General appearance:  Normal affect, orientation and appearance. Skin: Grossly normal HEENT: Without gross lesions.  No cervical or supraclavicular adenopathy. Thyroid normal.  Lungs:  Clear without wheezing, rales or rhonchi Cardiac: RR, without RMG Abdominal:  Soft, nontender, without masses, guarding, rebound, organomegaly or hernia Breasts:  Examined lying and sitting without masses, retractions, discharge or axillary adenopathy. Pelvic:  Ext, BUS, Vagina: With atrophic changes  Cervix: With atrophic changes  Uterus: Anteverted, normal size, shape and contour, midline and mobile nontender   Adnexa: Without masses or tenderness    Anus and perineum: Normal   Rectovaginal: Normal sphincter tone without palpated masses or tenderness.    Assessment/Plan:  63 y.o. G0P0 female for annual gynecologic exam.   1. Postmenopausal.  No significant menopausal symptoms or any vaginal bleeding. 2. Pap smear/HPV 2018.  No Pap smear done today.  No history of abnormal Pap smears.  Plan repeat Pap smear/HPV at 5-year interval per current screening guidelines. 3. Mammography scheduled in October.  Breast exam normal today. 4. Colonoscopy 2013.  Repeat at their recommended interval. 5. DEXA  never.  We will plan baseline DEXA now at age 30.  Patient will schedule and follow-up for this. 6. Health maintenance.  No routine lab work done as patient does this elsewhere.  Follow-up 1 year, sooner as needed.   Anastasio Auerbach MD, 10:42 AM 08/20/2019

## 2019-08-20 NOTE — Patient Instructions (Signed)
Follow-up for the bone density as scheduled.  Follow-up in 1 year for annual exam 

## 2019-09-02 ENCOUNTER — Encounter: Payer: Self-pay | Admitting: Gynecology

## 2019-09-03 ENCOUNTER — Other Ambulatory Visit: Payer: Self-pay

## 2019-09-04 ENCOUNTER — Ambulatory Visit (INDEPENDENT_AMBULATORY_CARE_PROVIDER_SITE_OTHER): Payer: BC Managed Care – PPO

## 2019-09-04 DIAGNOSIS — Z13820 Encounter for screening for osteoporosis: Secondary | ICD-10-CM | POA: Diagnosis not present

## 2019-09-04 DIAGNOSIS — Z01419 Encounter for gynecological examination (general) (routine) without abnormal findings: Secondary | ICD-10-CM

## 2019-09-04 DIAGNOSIS — M8589 Other specified disorders of bone density and structure, multiple sites: Secondary | ICD-10-CM | POA: Diagnosis not present

## 2019-09-04 DIAGNOSIS — Z780 Asymptomatic menopausal state: Secondary | ICD-10-CM | POA: Diagnosis not present

## 2019-09-04 DIAGNOSIS — M8580 Other specified disorders of bone density and structure, unspecified site: Secondary | ICD-10-CM

## 2019-09-04 HISTORY — DX: Other specified disorders of bone density and structure, unspecified site: M85.80

## 2019-09-05 ENCOUNTER — Other Ambulatory Visit: Payer: Self-pay | Admitting: Gynecology

## 2019-09-05 DIAGNOSIS — M8589 Other specified disorders of bone density and structure, multiple sites: Secondary | ICD-10-CM

## 2019-09-05 DIAGNOSIS — Z13820 Encounter for screening for osteoporosis: Secondary | ICD-10-CM

## 2019-09-05 DIAGNOSIS — Z780 Asymptomatic menopausal state: Secondary | ICD-10-CM

## 2019-09-08 ENCOUNTER — Encounter: Payer: Self-pay | Admitting: Gynecology

## 2019-09-15 ENCOUNTER — Ambulatory Visit
Admission: RE | Admit: 2019-09-15 | Discharge: 2019-09-15 | Disposition: A | Discharge status: Home / Self Care | Payer: BC Managed Care – PPO | Source: Ambulatory Visit | Attending: Gynecology | Admitting: Gynecology

## 2019-09-15 ENCOUNTER — Other Ambulatory Visit: Payer: Self-pay

## 2019-09-15 DIAGNOSIS — Z1231 Encounter for screening mammogram for malignant neoplasm of breast: Secondary | ICD-10-CM | POA: Diagnosis not present

## 2020-02-25 DIAGNOSIS — D485 Neoplasm of uncertain behavior of skin: Secondary | ICD-10-CM | POA: Diagnosis not present

## 2020-02-25 DIAGNOSIS — D1801 Hemangioma of skin and subcutaneous tissue: Secondary | ICD-10-CM | POA: Diagnosis not present

## 2020-02-25 DIAGNOSIS — I788 Other diseases of capillaries: Secondary | ICD-10-CM | POA: Diagnosis not present

## 2020-02-25 DIAGNOSIS — L821 Other seborrheic keratosis: Secondary | ICD-10-CM | POA: Diagnosis not present

## 2020-02-25 DIAGNOSIS — Q828 Other specified congenital malformations of skin: Secondary | ICD-10-CM | POA: Diagnosis not present

## 2020-02-25 DIAGNOSIS — D225 Melanocytic nevi of trunk: Secondary | ICD-10-CM | POA: Diagnosis not present

## 2020-08-23 ENCOUNTER — Encounter: Payer: BC Managed Care – PPO | Admitting: Obstetrics and Gynecology

## 2020-10-06 ENCOUNTER — Encounter: Payer: Self-pay | Admitting: Obstetrics and Gynecology

## 2020-10-06 ENCOUNTER — Other Ambulatory Visit: Payer: Self-pay

## 2020-10-06 ENCOUNTER — Ambulatory Visit (INDEPENDENT_AMBULATORY_CARE_PROVIDER_SITE_OTHER): Payer: BC Managed Care – PPO | Admitting: Obstetrics and Gynecology

## 2020-10-06 VITALS — BP 120/74 | Ht 65.0 in | Wt 176.0 lb

## 2020-10-06 DIAGNOSIS — Z01419 Encounter for gynecological examination (general) (routine) without abnormal findings: Secondary | ICD-10-CM

## 2020-10-06 DIAGNOSIS — M8580 Other specified disorders of bone density and structure, unspecified site: Secondary | ICD-10-CM | POA: Diagnosis not present

## 2020-10-06 NOTE — Progress Notes (Signed)
   Keily Lepp [facility] [birth year] [id number]  SUBJECTIVE:  64 y.o. G0P0 female for annual routine gynecologic exam. She has no gynecologic concerns.  Has noticed some changes in her stool habits sometimes she will have small clumps of harder stool other times she will have more mushy stool.  She feels that taking probiotic has helped.  No blood in the stool or change in the color.  Current Outpatient Medications  Medication Sig Dispense Refill  . Cholecalciferol (VITAMIN D3 PO) Take by mouth. occasionally    . Cyanocobalamin (VITAMIN B-12 PO) Take by mouth. occasionally    . Multiple Vitamin (MULTIVITAMIN) tablet Take 1 tablet by mouth daily.     No current facility-administered medications for this visit.   Allergies: Ibuprofen  Patient's last menstrual period was 03/04/2012.  Past medical history,surgical history, problem list, medications, allergies, family history and social history were all reviewed and documented as reviewed in the EPIC chart.  ROS: Pertinent positives and negatives as reviewed in HPI   OBJECTIVE:  Ht 5\' 5"  (1.651 m)   Wt 176 lb (79.8 kg)   LMP 03/04/2012   BMI 29.29 kg/m  The patient appears well, alert, oriented, in no distress. ENT normal.  Neck supple. No cervical or supraclavicular adenopathy or thyromegaly.  Lungs are clear, good air entry, no wheezes, rhonchi or rales. S1 and S2 normal, no murmurs, regular rate and rhythm.  Abdomen soft without tenderness, guarding, mass or organomegaly.  Neurological is normal, no focal findings.  BREAST EXAM: breasts appear normal, no suspicious masses, no skin or nipple changes or axillary nodes  PELVIC EXAM: VULVA: normal appearing vulva with atrophic change, no masses, tenderness or lesions, VAGINA: normal appearing vagina with atrophic change, normal color and discharge, no lesions, CERVIX: normal appearing cervix without discharge or lesions, UTERUS: uterus is normal size, shape, consistency and nontender,  ADNEXA: normal adnexa in size, nontender and no masses, RECTAL: normal rectal, no masses  Chaperone: 05/04/2012 present during the examination  ASSESSMENT:  64 y.o. G0P0 here for annual gynecologic exam  PLAN:   1. Postmenopausal.  No significant hot flashes or night sweats.  No vaginal bleeding. 2. Pap smear/HPV 2018.  No significant history of abnormal Pap smears.  Next Pap smear due 2023 following the current guidelines recommending the 5 year interval. 3. Mammogram 09/2019.  Normal breast exam today.  She says she will be planning to do a mammogram this next month.   4. Colonoscopy 2013.  Recommended that she follow up at the recommended interval.  Also recommended that she get consistent fiber in her diet and increase hydration, if persistent bowel habit changes or any discomfort then I recommend that she check in with her primary care doctor. 5. DEXA 09/2019.  T-score -1.9 bilateral femoral necks.  Next DEXA recommended 2022, encourage vitamin D and calcium in diet/supplement 1200 mg/day and weight bearing exercise.  6. Health maintenance.  No labs today as she normally has these completed elsewhere.  Return annually or sooner, prn.  2023 MD 10/06/20

## 2020-10-12 DIAGNOSIS — Z0184 Encounter for antibody response examination: Secondary | ICD-10-CM | POA: Diagnosis not present

## 2021-08-19 ENCOUNTER — Other Ambulatory Visit: Payer: Self-pay | Admitting: Family Medicine

## 2021-08-19 DIAGNOSIS — Z1231 Encounter for screening mammogram for malignant neoplasm of breast: Secondary | ICD-10-CM

## 2021-09-22 ENCOUNTER — Other Ambulatory Visit: Payer: Self-pay

## 2021-09-22 ENCOUNTER — Ambulatory Visit
Admission: RE | Admit: 2021-09-22 | Discharge: 2021-09-22 | Disposition: A | Discharge status: Home / Self Care | Payer: 59 | Source: Ambulatory Visit | Attending: Family Medicine | Admitting: Family Medicine

## 2021-09-22 DIAGNOSIS — Z1231 Encounter for screening mammogram for malignant neoplasm of breast: Secondary | ICD-10-CM

## 2021-12-30 IMAGING — MG MM DIGITAL SCREENING BILAT W/ TOMO AND CAD
8 series · 8 of 24 positions shown · non-contrast
Comparison: Previous exam(s).

CLINICAL DATA: Screening.

EXAM:
DIGITAL SCREENING BILATERAL MAMMOGRAM WITH TOMOSYNTHESIS AND CAD
TECHNIQUE: Bilateral screening digital craniocaudal and mediolateral oblique
mammograms were obtained. Bilateral screening digital breast
tomosynthesis was performed. The images were evaluated with
computer-aided detection.

[L MLO synth-2D]
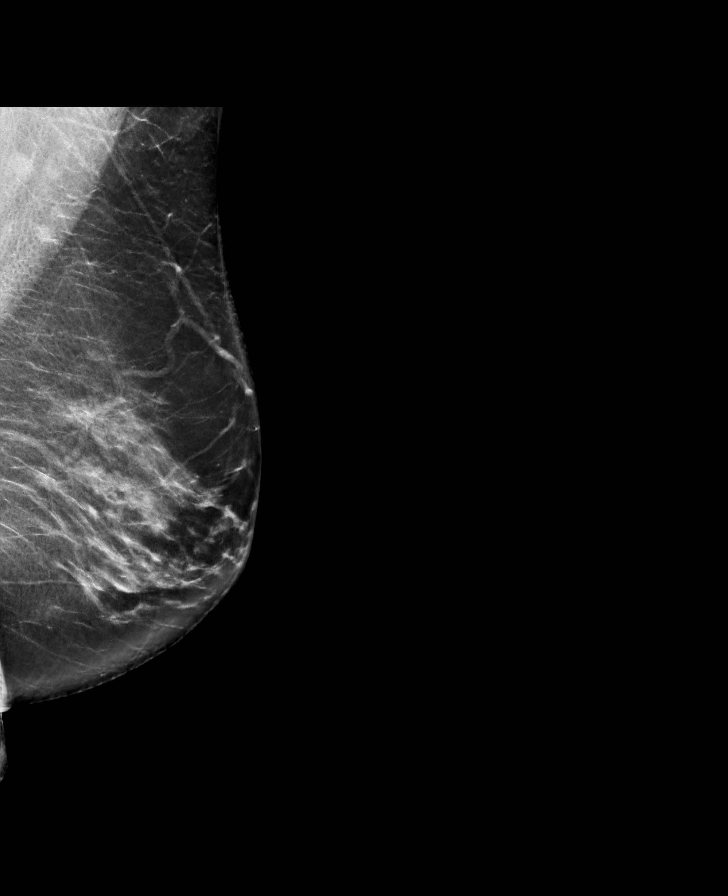

[R MLO synth-2D]
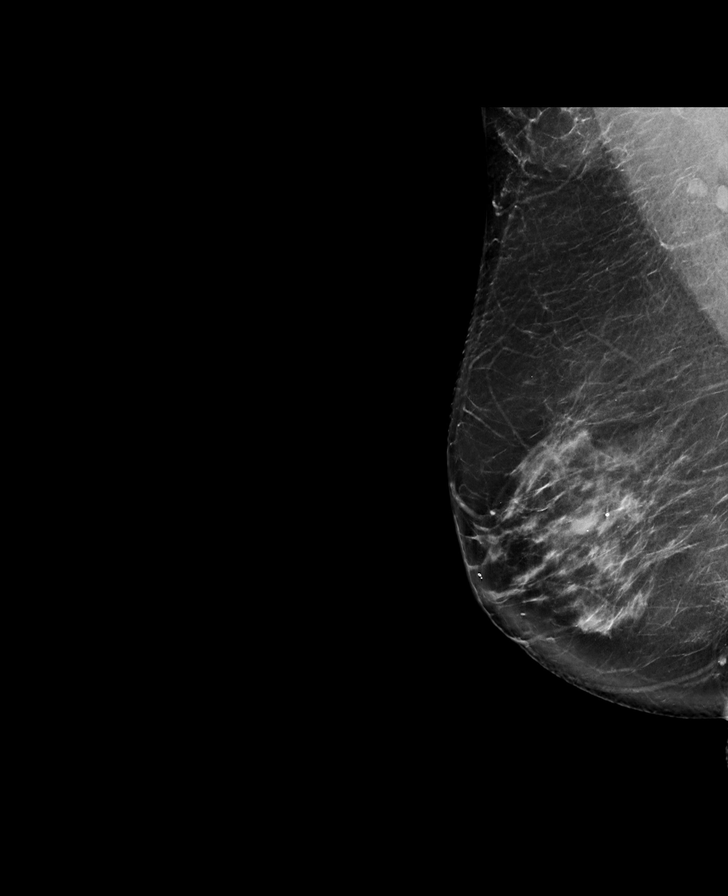

[L CC synth-2D]
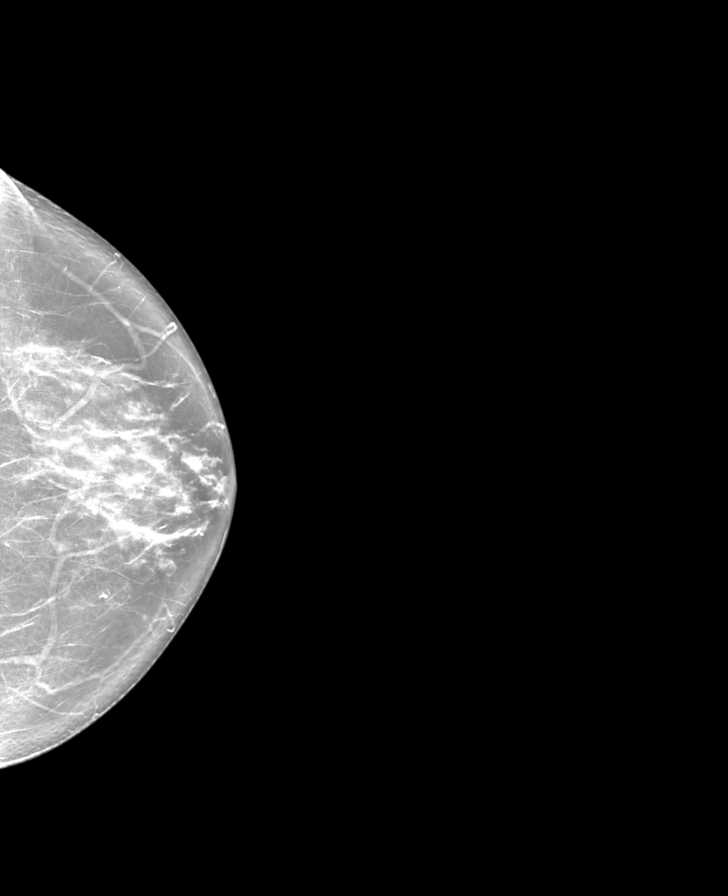

[R CC synth-2D]
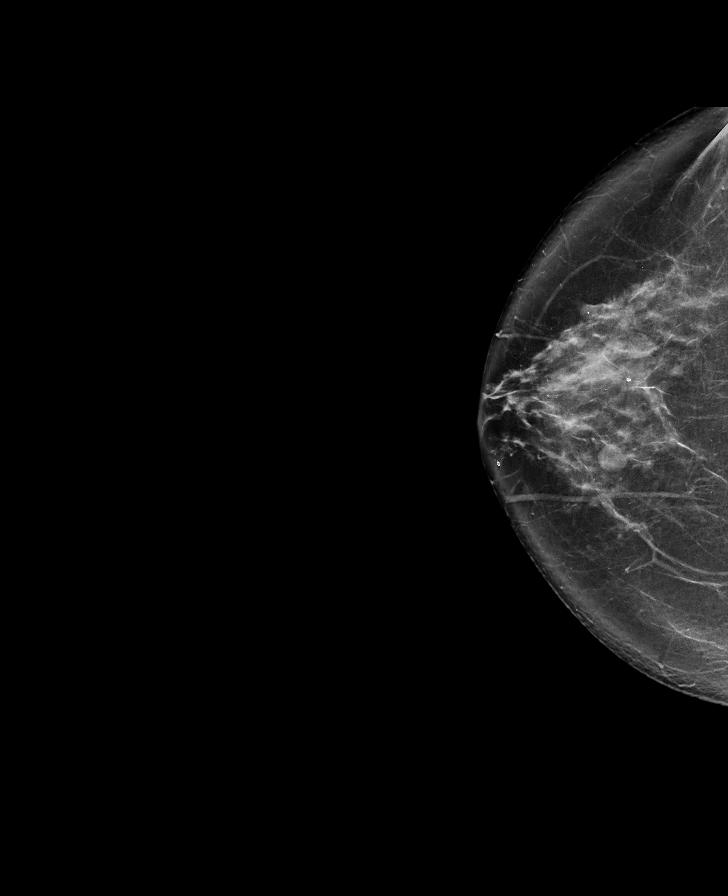

[R CC tomo · tomo slice 41/82.0]
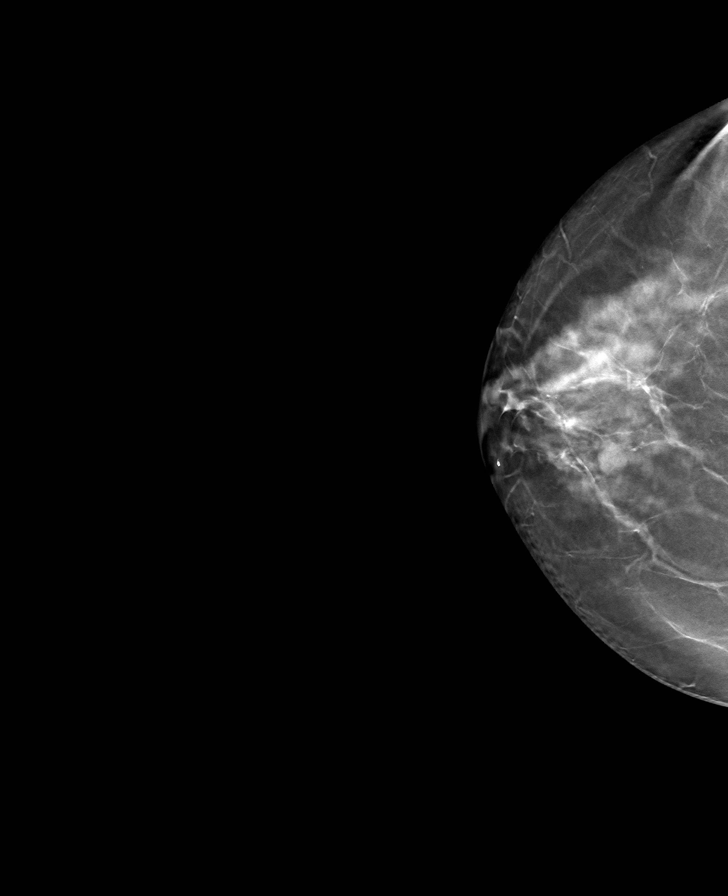

[L CC tomo · tomo slice 30/59.0]
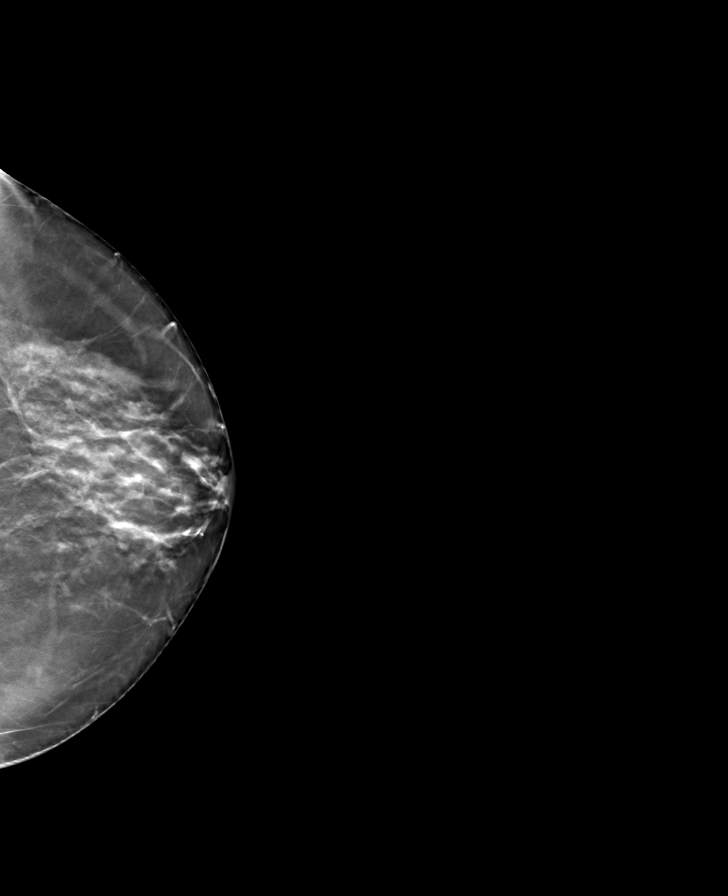

[R MLO tomo · tomo slice 45/88.0]
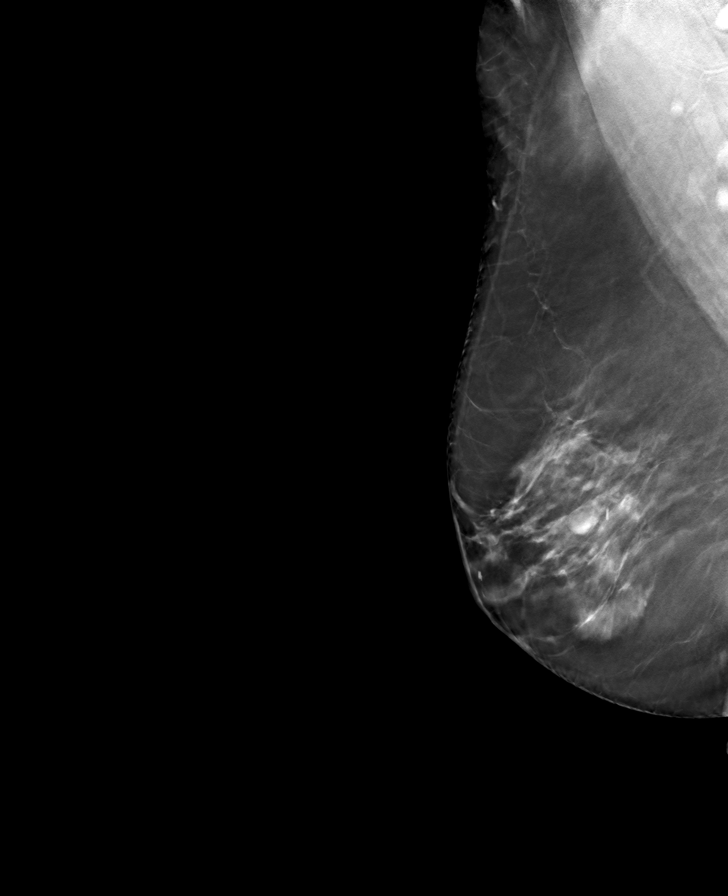

[L MLO tomo · tomo slice 45/88.0]
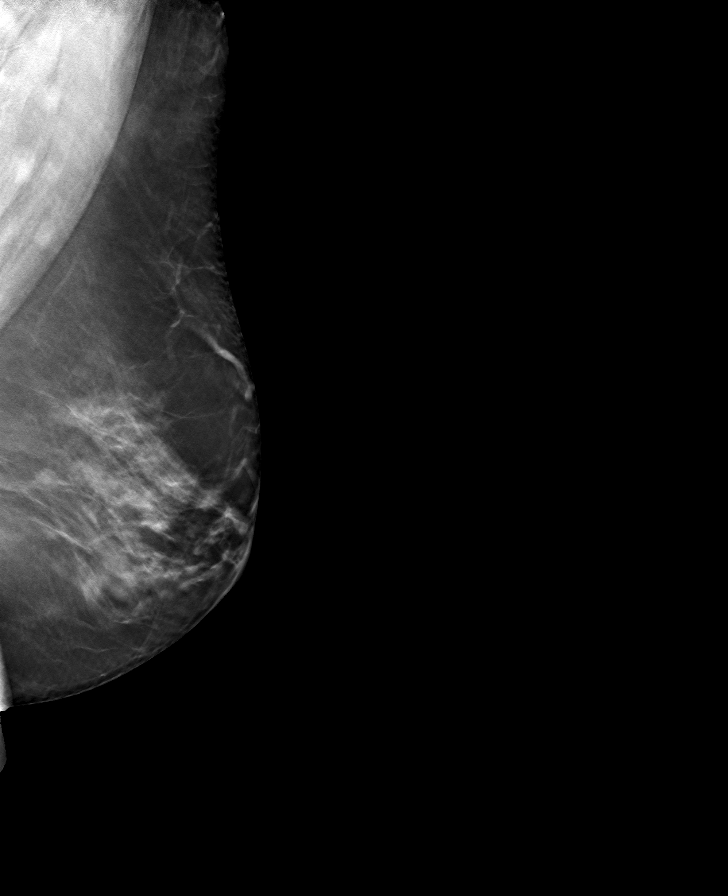

[8 of 24 positions shown; findings below may reference images not displayed]

ACR Breast Density Category c: The breast tissue is heterogeneously
dense, which may obscure small masses.
FINDINGS: There are no findings suspicious for malignancy.
IMPRESSION: No mammographic evidence of malignancy. A result letter of this
screening mammogram will be mailed directly to the patient.

RECOMMENDATION:
Screening mammogram in one year. (Code:Q3-W-BC3)

BI-RADS CATEGORY  1: Negative.

## 2022-05-29 ENCOUNTER — Other Ambulatory Visit: Payer: Self-pay | Admitting: Family Medicine

## 2022-05-29 ENCOUNTER — Ambulatory Visit
Admission: RE | Admit: 2022-05-29 | Discharge: 2022-05-29 | Disposition: A | Discharge status: Home / Self Care | Payer: 59 | Source: Ambulatory Visit | Attending: Family Medicine | Admitting: Family Medicine

## 2022-05-29 DIAGNOSIS — M25512 Pain in left shoulder: Secondary | ICD-10-CM

## 2022-05-31 ENCOUNTER — Other Ambulatory Visit: Payer: Self-pay | Admitting: Family Medicine

## 2022-05-31 ENCOUNTER — Ambulatory Visit
Admission: RE | Admit: 2022-05-31 | Discharge: 2022-05-31 | Disposition: A | Discharge status: Home / Self Care | Payer: 59 | Source: Ambulatory Visit | Attending: Family Medicine | Admitting: Family Medicine

## 2022-05-31 DIAGNOSIS — M25512 Pain in left shoulder: Secondary | ICD-10-CM

## 2022-06-05 ENCOUNTER — Other Ambulatory Visit: Payer: Self-pay | Admitting: Orthopedic Surgery

## 2022-06-05 DIAGNOSIS — M25512 Pain in left shoulder: Secondary | ICD-10-CM

## 2022-06-07 ENCOUNTER — Ambulatory Visit
Admission: RE | Admit: 2022-06-07 | Discharge: 2022-06-07 | Disposition: A | Discharge status: Home / Self Care | Payer: No Typology Code available for payment source | Source: Ambulatory Visit | Attending: Orthopedic Surgery | Admitting: Orthopedic Surgery

## 2022-06-07 DIAGNOSIS — M25512 Pain in left shoulder: Secondary | ICD-10-CM

## 2023-07-06 ENCOUNTER — Other Ambulatory Visit: Payer: Self-pay | Admitting: Family Medicine

## 2023-07-06 DIAGNOSIS — Z1231 Encounter for screening mammogram for malignant neoplasm of breast: Secondary | ICD-10-CM

## 2023-07-09 ENCOUNTER — Ambulatory Visit: Admission: RE | Admit: 2023-07-09 | Payer: 59 | Source: Ambulatory Visit

## 2023-07-09 DIAGNOSIS — Z1231 Encounter for screening mammogram for malignant neoplasm of breast: Secondary | ICD-10-CM

## 2024-03-25 ENCOUNTER — Other Ambulatory Visit: Payer: Self-pay | Admitting: Family Medicine

## 2024-03-25 DIAGNOSIS — I2510 Atherosclerotic heart disease of native coronary artery without angina pectoris: Secondary | ICD-10-CM

## 2024-03-25 NOTE — Progress Notes (Signed)
 HLD and at risk for CAD. Asymptomatic.  CAC ordered.
